# Patient Record
Sex: Male | Born: 2000 | Race: Black or African American | Hispanic: No | Marital: Single | Smoking: Never smoker
Health system: Southern US, Community
[De-identification: ages and names within clinical notes are randomized; demographics above are authoritative.]

---
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
## 2020-04-22 DEATH — deceased
